# Patient Record
Sex: Male | Born: 1980 | Race: White | Hispanic: No | Marital: Single | Smoking: Never smoker
Health system: Southern US, Community
[De-identification: ages and names within clinical notes are randomized; demographics above are authoritative.]

## PROBLEM LIST (undated history)

## (undated) HISTORY — PX: KNEE SURGERY: SHX244

## (undated) HISTORY — PX: OTHER SURGICAL HISTORY: SHX169

---

## 2012-03-17 ENCOUNTER — Encounter (HOSPITAL_COMMUNITY): Payer: Self-pay | Admitting: Emergency Medicine

## 2012-03-17 ENCOUNTER — Emergency Department (HOSPITAL_COMMUNITY)
Admission: EM | Admit: 2012-03-17 | Discharge: 2012-03-17 | Disposition: A | Discharge status: Home / Self Care | Payer: Worker's Compensation | Attending: Emergency Medicine | Admitting: Emergency Medicine

## 2012-03-17 ENCOUNTER — Emergency Department (HOSPITAL_COMMUNITY): Payer: Worker's Compensation

## 2012-03-17 DIAGNOSIS — Y9289 Other specified places as the place of occurrence of the external cause: Secondary | ICD-10-CM | POA: Insufficient documentation

## 2012-03-17 DIAGNOSIS — M25539 Pain in unspecified wrist: Secondary | ICD-10-CM | POA: Insufficient documentation

## 2012-03-17 DIAGNOSIS — W208XXA Other cause of strike by thrown, projected or falling object, initial encounter: Secondary | ICD-10-CM | POA: Insufficient documentation

## 2012-03-17 DIAGNOSIS — M25532 Pain in left wrist: Secondary | ICD-10-CM

## 2012-03-17 NOTE — ED Notes (Signed)
Pt states he was at work and a piece of equipment fell on it  Pt has pain in his left wrist and hand  Pt has had ice on it since it happened

## 2012-03-17 NOTE — Discharge Instructions (Signed)
Your xrays are fortunately negative. Use the wrist splint as needed and continue to apply ice. You may take up to 800 mg of ibuprofen three times daily (with food) for pain as needed. Return to the ED with worsening pain, trouble moving the hand, increased swelling, or any other worrisome symptoms.  RESOURCE GUIDE  Chronic Pain Problems: Contact [facility]  [phone] Patients need to be referred by their primary care doctor.  Insufficient Money for Medicine: Contact United Way:  call "211" or [facility] (747)227-9174.  No Primary Care Doctor: - Call [facility]  [phone] - can help you locate a primary care doctor that  accepts your insurance, provides certain services, etc. - Physician Referral Service- [phone]  Agencies that provide inexpensive medical care: - [facility]  [phone] - [facility]  [phone] - [facility]  [phone] - Women's Clinic  [phone] - Planned Parenthood  [phone] [facility]  [phone]  Medicaid-accepting [facility] Providers: - [facility]- [address]  [phone], Mon-Fri 9am-7pm, Sat 9am-1pm - [facility]- [address], [address] [place]  [phone] - [facility]- [address] [place]  [phone] [facility]- [address]  [phone] - Renaye Rakers- [address] [place], [address], [phone]  Only accepts [place] patients after they have their name  applied to their card  Self Pay (no insurance) in [place]: - Sickle Cell Patients: Dr Willey Blade, [facility]  [address] [place], [phone] - [facility]- [address] [place]  [phone]       [facility]- [address]       -     [facility]- see information above (Speak to [employer] if you do not have  insurance)       -  [facility]- [address] [place], [phone]       -  [facility]- [address],  [phone]       -  [facility]- [address] [phone]       -  Dr Julio Sicks-  [address], [place], [phone]       -  [facility]- [address] [phone]       -  [facility]- [address], [phone], also [address] [phone]       -    [facility]- [address] [place], [phone], 1st & 3rd Saturday   every month, 10am-1pm  1) Find a Doctor and Pay Out of Pocket Although you won't have to find out who is covered by your insurance plan, it is a good idea to ask around and get recommendations. You will then need to call the office and see if the doctor you have chosen will accept you as a new patient and what types of options they offer for patients who are self-pay. Some doctors offer discounts or will set up payment plans for their patients who do not have insurance, but you will need to ask so you aren't surprised when you get to your appointment.  2) Contact Your Local Health Department Not all health departments have doctors that can see patients for  sick visits, but many do, so it is worth a call to see if yours does. If you don't know where your local health department is, you can check in your phone book. The CDC also has a tool to help you locate your state's health department, and many state websites also have listings of all of their local health departments.  3) Find a Walk-in Clinic If your illness is not likely to be very severe or complicated, you may want to try a walk in clinic. These are popping up all over the country in pharmacies, drugstores, and shopping centers. They're usually staffed by nurse practitioners or physician assistants that have been trained to treat common illnesses and complaints. They're usually fairly quick and inexpensive. However, if you have serious  medical issues or chronic medical problems, these are probably not your best option  STD Testing - [facility] Department of [facility], STD Clinic, [address] [place], phone [phone] or [phone].  Monday - Friday, call for an appointment. [facility] Department of [employer], STD Clinic, [address], [place], phone [phone] or [phone].  Monday - Friday, call for an appointment.  Abuse/Neglect: [facility] Child Abuse Hotline 605-782-9479 [facility] Child Abuse Hotline (424)216-3728 (After Hours)  Emergency Shelter:  [facility] (563) 781-4696  Maternity Homes: - [facility] [place] [facility] (405)868-0441 - [facility] [phone]  MRSA Hotline #:   8302308691  [facility] Resources  [facility]  [facility] Dept. [address]                 [address]         [address] [place] [address]  Blondell Reveal Phone:  [phone]                                  Phone:  [phone]                   Phone:  [phone]  [facility], [phone] - [phone]       -     [facility] in [place], [address]                                  [phone], [facility] Child Abuse Hotline 956-366-6416 or (234)230-8465 (After Hours)   Behavioral Health Services  Substance Abuse Resources: - [facility] Drug [facility]  [phone] - [facility] ([phone] - [facility] [phone] Floydene Flock 7262216067 - Residential & Outpatient Substance Abuse Program  [phone]  Psychological Services: [facility]  [phone] Services  [phone] - [facility], [phone] [place]. [address] [place], ACCESS LINE:  [phone] or [phone], [employer]  Dental Assistance  If unable to pay or uninsured, contact:  [facility] or [facility]. to become qualified for the adult dental clinic.  Patients with Medicaid: [facility] [phone] [address], [phone] [address] [phone]  If unable to pay, or uninsured, contact [facility] (541)350-3448) or [facility] [phone] in [place], [phone] in [facility]) to become qualified for the adult dental clinic  Other Low-Cost Community Dental Services: - [facility]- [address] [place], [place], [place], [address], [phone], [phone], 2nd and 4th Thursday of the month at 6:30am.  10 clients each day by appointment, can sometimes see walk-in patients if someone does not show for an appointment. [facility]- [address] [place], [place], [address], [phone] - [facility]- [address] [place], [place], [address], [phone] - [facility]- [phone] [facility] Department- [phone] [facility] Department- [phone]

## 2012-03-19 NOTE — ED Provider Notes (Signed)
History     CSN: [id number]  Arrival date & time 03/17/12  1940   First MD Initiated Contact with Patient 03/17/12 2209      Chief Complaint  Patient presents with  . Hand Injury    (Consider location/radiation/quality/duration/timing/severity/associated sxs/prior treatment) Patient is a 31 y.o. male presenting with hand injury. The history is provided by the patient.  Hand Injury  The incident occurred 3 to 5 hours ago. The incident occurred at work. The injury mechanism was compression. The pain is present in the left wrist and left hand. The quality of the pain is described as sharp. The pain is severe. The pain has been improving since the incident. He reports no foreign bodies present. The symptoms are aggravated by movement, use and palpation. He has tried ice for the symptoms. The treatment provided moderate relief.   Pt states he was at work when a heavy piece of equipment fell on his left hand. Currently c/o pain to the dorsum of his hand. He immediately applied ice to the area which has been significantly helpful. Denies numbness, weakness in the hand but pain increases with movement at the wrist.  He is right handed.  History reviewed. No pertinent past medical history.  Past Surgical History  Procedure Date  . Extraction of wisdom teeth   . Knee surgery   . Ulna surgery     Family History  Problem Relation Age of Onset  . Cancer Other   . Stroke Other   . Coronary artery disease Other     History  Substance Use Topics  . Smoking status: Never Smoker   . Smokeless tobacco: Not on file  . Alcohol Use: Yes     social       Review of Systems  Constitutional: Negative.   Musculoskeletal: Positive for myalgias.  Skin: Negative for wound.  Neurological: Negative for weakness and numbness.    Allergies  Sulfa antibiotics  Home Medications   Current Outpatient Rx  Name Route Sig Dispense Refill  . CETIRIZINE HCL 10 MG PO TABS Oral Take 10 mg by mouth  daily.    . IBUPROFEN 400 MG PO TABS Oral Take 400 mg by mouth every 6 (six) hours as needed.      BP 144/80  Pulse 84  Temp 98.1 F (36.7 C) (Oral)  Resp 18  SpO2 99%  Physical Exam  Nursing note and vitals reviewed. Constitutional: He appears well-developed and well-nourished. No distress.  HENT:  Head: Normocephalic and atraumatic.  Neck: Normal range of motion.  Cardiovascular: Normal rate.   Pulmonary/Chest: Effort normal.  Musculoskeletal: Normal range of motion.       Hands:      Slight erythema without edema to dorsum of L hand. Dorsum tender to palp as diagrammed. Pain with dorsiflexion of wrist. Neurovascularly intact with sensory intact to light touch in radian, median, and ulnar distributions. Radial pulse intact. Capillary refill less than 3 seconds.  Neurological: He is alert.  Skin: Skin is warm and dry. He is not diaphoretic.  Psychiatric: He has a normal mood and affect.    ED Course  Procedures (including critical care time)  Labs Reviewed - No data to display Dg Wrist Complete Left  03/17/2012  *RADIOLOGY REPORT*  Clinical Data: Pain, injury  LEFT WRIST - COMPLETE 3+ VIEW  Comparison: 03/17/2012  Findings: Normal alignment.  No fracture.  Intact distal radius, ulna and carpal bones.  No soft tissue swelling.  IMPRESSION: No acute osseous  finding  Original Report Authenticated By: Judie Petit. Ruel Favors, M.D.   Dg Hand Complete Left  03/17/2012  *RADIOLOGY REPORT*  Clinical Data: Pain, injury  LEFT HAND - COMPLETE 3+ VIEW  Comparison: None.  Findings: Normal alignment.  No fracture.  No radiographic swelling or foreign body.  IMPRESSION: No acute osseous finding  Original Report Authenticated By: Judie Petit. TREVOR Miles Costain, M.D.     1. Left wrist pain       MDM  Pt with pain to wrist and hand after a piece of equipment fell on it. Images which I personally reviewed are fortunately negative for acute findings. Pt placed in wrist splint for comfort. Note given to stay on  light duty at work for remainder of week. To f/u with ortho if sx persist. Return precautions discussed.        Grant Fontana, PA-C 03/19/12 0148

## 2012-03-19 NOTE — ED Provider Notes (Signed)
Medical screening examination/treatment/procedure(s) were performed by non-physician practitioner and as supervising physician I was immediately available for consultation/collaboration.  Flint Melter, MD 03/19/12 [phone]

## 2019-11-20 ENCOUNTER — Emergency Department (HOSPITAL_BASED_OUTPATIENT_CLINIC_OR_DEPARTMENT_OTHER)
Admission: EM | Admit: 2019-11-20 | Discharge: 2019-11-20 | Disposition: A | Discharge status: Home / Self Care | Payer: Worker's Compensation | Attending: Emergency Medicine | Admitting: Emergency Medicine

## 2019-11-20 ENCOUNTER — Other Ambulatory Visit: Payer: Self-pay

## 2019-11-20 ENCOUNTER — Encounter (HOSPITAL_BASED_OUTPATIENT_CLINIC_OR_DEPARTMENT_OTHER): Payer: Self-pay

## 2019-11-20 ENCOUNTER — Emergency Department (HOSPITAL_BASED_OUTPATIENT_CLINIC_OR_DEPARTMENT_OTHER): Payer: Worker's Compensation

## 2019-11-20 DIAGNOSIS — Y999 Unspecified external cause status: Secondary | ICD-10-CM | POA: Diagnosis not present

## 2019-11-20 DIAGNOSIS — S6702XA Crushing injury of left thumb, initial encounter: Secondary | ICD-10-CM

## 2019-11-20 DIAGNOSIS — Z79899 Other long term (current) drug therapy: Secondary | ICD-10-CM | POA: Insufficient documentation

## 2019-11-20 DIAGNOSIS — S61112A Laceration without foreign body of left thumb with damage to nail, initial encounter: Secondary | ICD-10-CM | POA: Diagnosis not present

## 2019-11-20 DIAGNOSIS — Y939 Activity, unspecified: Secondary | ICD-10-CM | POA: Diagnosis not present

## 2019-11-20 DIAGNOSIS — W231XXA Caught, crushed, jammed, or pinched between stationary objects, initial encounter: Secondary | ICD-10-CM | POA: Diagnosis not present

## 2019-11-20 DIAGNOSIS — Z882 Allergy status to sulfonamides status: Secondary | ICD-10-CM | POA: Insufficient documentation

## 2019-11-20 DIAGNOSIS — Y929 Unspecified place or not applicable: Secondary | ICD-10-CM | POA: Insufficient documentation

## 2019-11-20 MED ORDER — LIDOCAINE HCL (PF) 1 % IJ SOLN
20.0000 mL | Freq: Once | INTRAMUSCULAR | Status: AC
  Administered 2019-11-20: 19:00:00 20 mL
  Filled 2019-11-20: qty 20

## 2019-11-20 MED ORDER — CEPHALEXIN 250 MG PO CAPS
500.0000 mg | ORAL_CAPSULE | Freq: Once | ORAL | Status: AC
  Administered 2019-11-20: 20:00:00 500 mg via ORAL
  Filled 2019-11-20: qty 2

## 2019-11-20 MED ORDER — LIDOCAINE HCL 1 % IJ SOLN
INTRAMUSCULAR | Status: AC
  Filled 2019-11-20: qty 20

## 2019-11-20 MED ORDER — TETANUS-DIPHTH-ACELL PERTUSSIS 5-2.5-18.5 LF-MCG/0.5 IM SUSP
0.5000 mL | Freq: Once | INTRAMUSCULAR | Status: DC
  Filled 2019-11-20: qty 0.5

## 2019-11-20 MED ORDER — CEPHALEXIN 500 MG PO CAPS: 500.0000 mg | ORAL_CAPSULE | Freq: Three times a day (TID) | ORAL | 0 refills | Status: AC

## 2019-11-20 NOTE — Discharge Instructions (Addendum)
Take antibiotics as prescribed to prevent possible infection.  Continue dressing as instructed.  No heavy lifting in your left hand, do not use your left thumb for any work or any light lifting.  Recommend light duty at work.  Keep clean and dry.  No soaking.  Please call Monday morning the hand surgeons office.  You should be evaluated ideally on Monday or Tuesday of next week.  If you develop worsening bleeding, swelling, purulent drainage, redness streaking up your finger or hand, return to ER for reassessment.

## 2019-11-20 NOTE — ED Triage Notes (Signed)
Pt reports a crush injury to left thumb at work ~445p-NAD-steady gait

## 2019-11-20 NOTE — ED Notes (Signed)
MD at bedside. 

## 2019-11-20 NOTE — ED Provider Notes (Addendum)
[facility] EMERGENCY DEPARTMENT Provider Note   CSN: [id number] Arrival date & time: 11/20/19  1735     History Chief Complaint  Patient presents with  . Finger Injury    Jonathan Torres is a 39 y.o. male.  Right-hand-dominant presents to ER after crush injury to left thumb.  At work, piece of machinery crushed his left thumb.  Denies any other associated injuries.  No numbness to area, bleeding stopped with direct pressure.  No prior injury to this area.  Pain with any sort of movement.  Pain improved with rest.  Has not taken any medicine for it.  Sharp, stabbing.  Last tetanus 3 years ago.  HPI     History reviewed. No pertinent past medical history.  There are no problems to display for this patient.   Past Surgical History:  Procedure Laterality Date  . extraction of wisdom teeth    . KNEE SURGERY    . ulna surgery         Family History  Problem Relation Age of Onset  . Cancer Other   . Stroke Other   . Coronary artery disease Other     Social History   Tobacco Use  . Smoking status: Never Smoker  . Smokeless tobacco: Never Used  Substance Use Topics  . Alcohol use: Yes    Comment: weekly  . Drug use: No    Home Medications Prior to Admission medications   Medication Sig Start Date End Date Taking? Authorizing Provider  cephALEXin (KEFLEX) 500 MG capsule Take 1 capsule (500 mg total) by mouth 3 (three) times daily for 7 days. 11/20/19 11/27/19  Milagros Loll, MD  cetirizine (ZYRTEC) 10 MG tablet Take 10 mg by mouth daily.    [provider]  ibuprofen (ADVIL,MOTRIN) 400 MG tablet Take 400 mg by mouth every 6 (six) hours as needed.    [provider]    Allergies    Sulfa antibiotics  Review of Systems   Review of Systems  Constitutional: Negative for chills and fever.  HENT: Negative for ear pain and sore throat.   Eyes: Negative for pain and visual disturbance.  Respiratory: Negative for cough and shortness of  breath.   Cardiovascular: Negative for chest pain and palpitations.  Gastrointestinal: Negative for abdominal pain and vomiting.  Genitourinary: Negative for dysuria and hematuria.  Musculoskeletal: Negative for arthralgias and back pain.  Skin: Negative for color change and rash.  Neurological: Negative for seizures and syncope.  All other systems reviewed and are negative.   Physical Exam Updated Vital Signs BP 134/87 (BP Location: Right Arm)   Pulse 75   Temp 97.7 F (36.5 C) (Oral)   Resp 18   Ht 5\' 11"  (1.803 m)   Wt 97.5 kg   SpO2 97%   BMI 29.99 kg/m   Physical Exam Constitutional:      Appearance: Normal appearance.  HENT:     Head: Normocephalic and atraumatic.     Nose: Nose normal.     Mouth/Throat:     Mouth: Mucous membranes are moist.     Pharynx: Oropharynx is clear.  Eyes:     Pupils: Pupils are equal, round, and reactive to light.  Cardiovascular:     Rate and Rhythm: Normal rate and regular rhythm.     Pulses: Normal pulses.  Pulmonary:     Effort: Pulmonary effort is normal. No respiratory distress.  Abdominal:     General: Abdomen is flat. There is  no distension.  Musculoskeletal:       Hands:     Comments: 1cm laceration over distal thumb, extending from ulnar side through nail bed; nail appears broken across middle, no active bleeding, distal sensation and cap refill intact  Skin:    General: Skin is warm and dry.     Capillary Refill: Capillary refill takes less than 2 seconds.  Neurological:     Mental Status: He is alert.     ED Results / Procedures / Treatments   Labs (all labs ordered are listed, but only abnormal results are displayed) Labs Reviewed - No data to display  EKG None  Radiology DG Finger Thumb Left  Result Date: 11/20/2019 CLINICAL DATA:  Crush injury EXAM: LEFT THUMB 2+V COMPARISON:  None. FINDINGS: Acute comminuted fracture involving the tuft of the first distal phalanx. No subluxation. No radiopaque foreign  body IMPRESSION: Acute comminuted tuft fracture involving first distal phalanx Electronically Signed   By: Jasmine Pang M.D.   On: 11/20/2019 18:20    Procedures .Nail Removal  Date/Time: 11/20/2019 11:26 PM Performed by: Milagros Loll, MD Authorized by: Milagros Loll, MD   Consent:    Consent obtained:  Verbal   Consent given by:  Patient   Risks discussed:  Bleeding   Alternatives discussed:  No treatment Location:    Hand:  L thumb Pre-procedure details:    Skin preparation:  Betadine Anesthesia (see MAR for exact dosages):    Anesthesia method:  None Nail Removal:    Nail removed:  Complete   Nail bed repaired: yes     Nail bed repair material:  5-0 vicryl   Number of sutures:  3   Removed nail replaced and anchored: yes     Stented with:  Small foil from chromic gut Post-procedure details:    Dressing:  Xeroform gauze   Patient tolerance of procedure:  Tolerated well, no immediate complications  .Marland KitchenLaceration Repair  Date/Time: 11/20/2019 11:28 PM Performed by: Milagros Loll, MD Authorized by: Milagros Loll, MD   Consent:    Consent obtained:  Verbal   Consent given by:  Patient   Risks discussed:  Infection, nerve damage, need for additional repair, poor cosmetic result, poor wound healing and vascular damage   Alternatives discussed:  No treatment Anesthesia (see MAR for exact dosages):    Anesthesia method: digit block, 1% lidocaine, 10mL to ulnar and radial side at base of thumb. Laceration details:    Length (cm):  1 Repair type:    Repair type:  Complex Exploration:    Hemostasis achieved with:  Direct pressure Treatment:    Area cleansed with:  Betadine and saline   Amount of cleaning:  Extensive   Irrigation solution:  Sterile saline   Irrigation volume:  1 L   Irrigation method:  Syringe Skin repair:    Repair method:  Sutures   Suture size:  5-0   Suture material:  Nylon   Number of sutures:  1 Approximation:     Approximation:  Close   (including critical care time)  Medications Ordered in ED Medications  lidocaine (XYLOCAINE) 1 % (with pres) injection (has no administration in time range)  lidocaine (PF) (XYLOCAINE) 1 % injection 20 mL (20 mLs Infiltration Given 11/20/19 1832)  cephALEXin (KEFLEX) capsule 500 mg (500 mg Oral Given 11/20/19 1956)    ED Course  I have reviewed the triage vital signs and the nursing notes.  Pertinent labs & imaging results  that were available during my care of the patient were reviewed by me and considered in my medical decision making (see chart for details).  Clinical Course as of Nov 19 2320  Fri Nov 20, 2019  1933 Completed hand procedure, d/w Donnal Moat    [RD]    Clinical Course User Index [RD] Lucrezia Starch, MD    MDM Rules/Calculators/A&P                      39 year old male distal thumb crush injury.  Nondominant hand.  Concern for nailbed injury.  X-ray with fracture distal tuft.  Performed digital block of thumb with lidocaine.  Then thoroughly irrigated with normal saline and diluted Betadine.  Remove nail.  Underlying laceration across nailbed.  Repaired nailbed with 5-0 Vicryl, repaired skin with 5-0 nylon.  Good approximation.  Inserted small foil to keep eponychial fold open.  I reviewed with hand surgery who will follow up with patient on Monday.  Prescribed Keflex per hand surgery request for ABX prophylaxis.  Patient tolerated procedure well, discharged home.  After the discussed management above, the patient was determined to be safe for discharge.  The patient was in agreement with this plan and all questions regarding their care were answered.  ED return precautions were discussed and the patient will return to the ED with any significant worsening of condition.   Final Clinical Impression(s) / ED Diagnoses Final diagnoses:  Crushing injury of left thumb, initial encounter  Laceration of left thumb without foreign body with damage to  nail, initial encounter    Rx / DC Orders ED Discharge Orders         Ordered    cephALEXin (KEFLEX) 500 MG capsule  3 times daily     11/20/19 1950           Lucrezia Starch, MD 11/20/19 4970    Lucrezia Starch, MD 11/20/19 [phone]

## 2020-04-22 ENCOUNTER — Encounter (HOSPITAL_BASED_OUTPATIENT_CLINIC_OR_DEPARTMENT_OTHER): Payer: Self-pay

## 2020-04-22 ENCOUNTER — Other Ambulatory Visit: Payer: Self-pay

## 2020-04-22 DIAGNOSIS — T2122XA Burn of second degree of abdominal wall, initial encounter: Secondary | ICD-10-CM | POA: Insufficient documentation

## 2020-04-22 DIAGNOSIS — T22232A Burn of second degree of left upper arm, initial encounter: Secondary | ICD-10-CM | POA: Insufficient documentation

## 2020-04-22 DIAGNOSIS — Y999 Unspecified external cause status: Secondary | ICD-10-CM | POA: Insufficient documentation

## 2020-04-22 DIAGNOSIS — Y939 Activity, unspecified: Secondary | ICD-10-CM | POA: Insufficient documentation

## 2020-04-22 DIAGNOSIS — Y929 Unspecified place or not applicable: Secondary | ICD-10-CM | POA: Insufficient documentation

## 2020-04-22 DIAGNOSIS — Z5321 Procedure and treatment not carried out due to patient leaving prior to being seen by health care provider: Secondary | ICD-10-CM | POA: Insufficient documentation

## 2020-04-22 NOTE — ED Triage Notes (Addendum)
Pt with steam burn from pressure cooker/canning ~9pm to left upper arm with blisters, left flank with blisters, redness to left chest area and right anterior thigh-pt took ibuprofen 800mg  at ~930pm-states greatest pain is to left UE-sterile saline 4x4 wet/dry dsg applied-NAD-steady gait

## 2020-04-23 ENCOUNTER — Emergency Department (HOSPITAL_BASED_OUTPATIENT_CLINIC_OR_DEPARTMENT_OTHER)
Admission: EM | Admit: 2020-04-23 | Discharge: 2020-04-23 | Disposition: A | Discharge status: Home / Self Care | Payer: BC Managed Care – PPO | Attending: Emergency Medicine | Admitting: Emergency Medicine

## 2021-08-19 IMAGING — CR DG FINGER THUMB 2+V*L*
3 series · 3 of 3 positions shown · non-contrast
Comparison: None.

CLINICAL DATA: Crush injury

EXAM:
LEFT THUMB 2+V

[x finger obl. left]
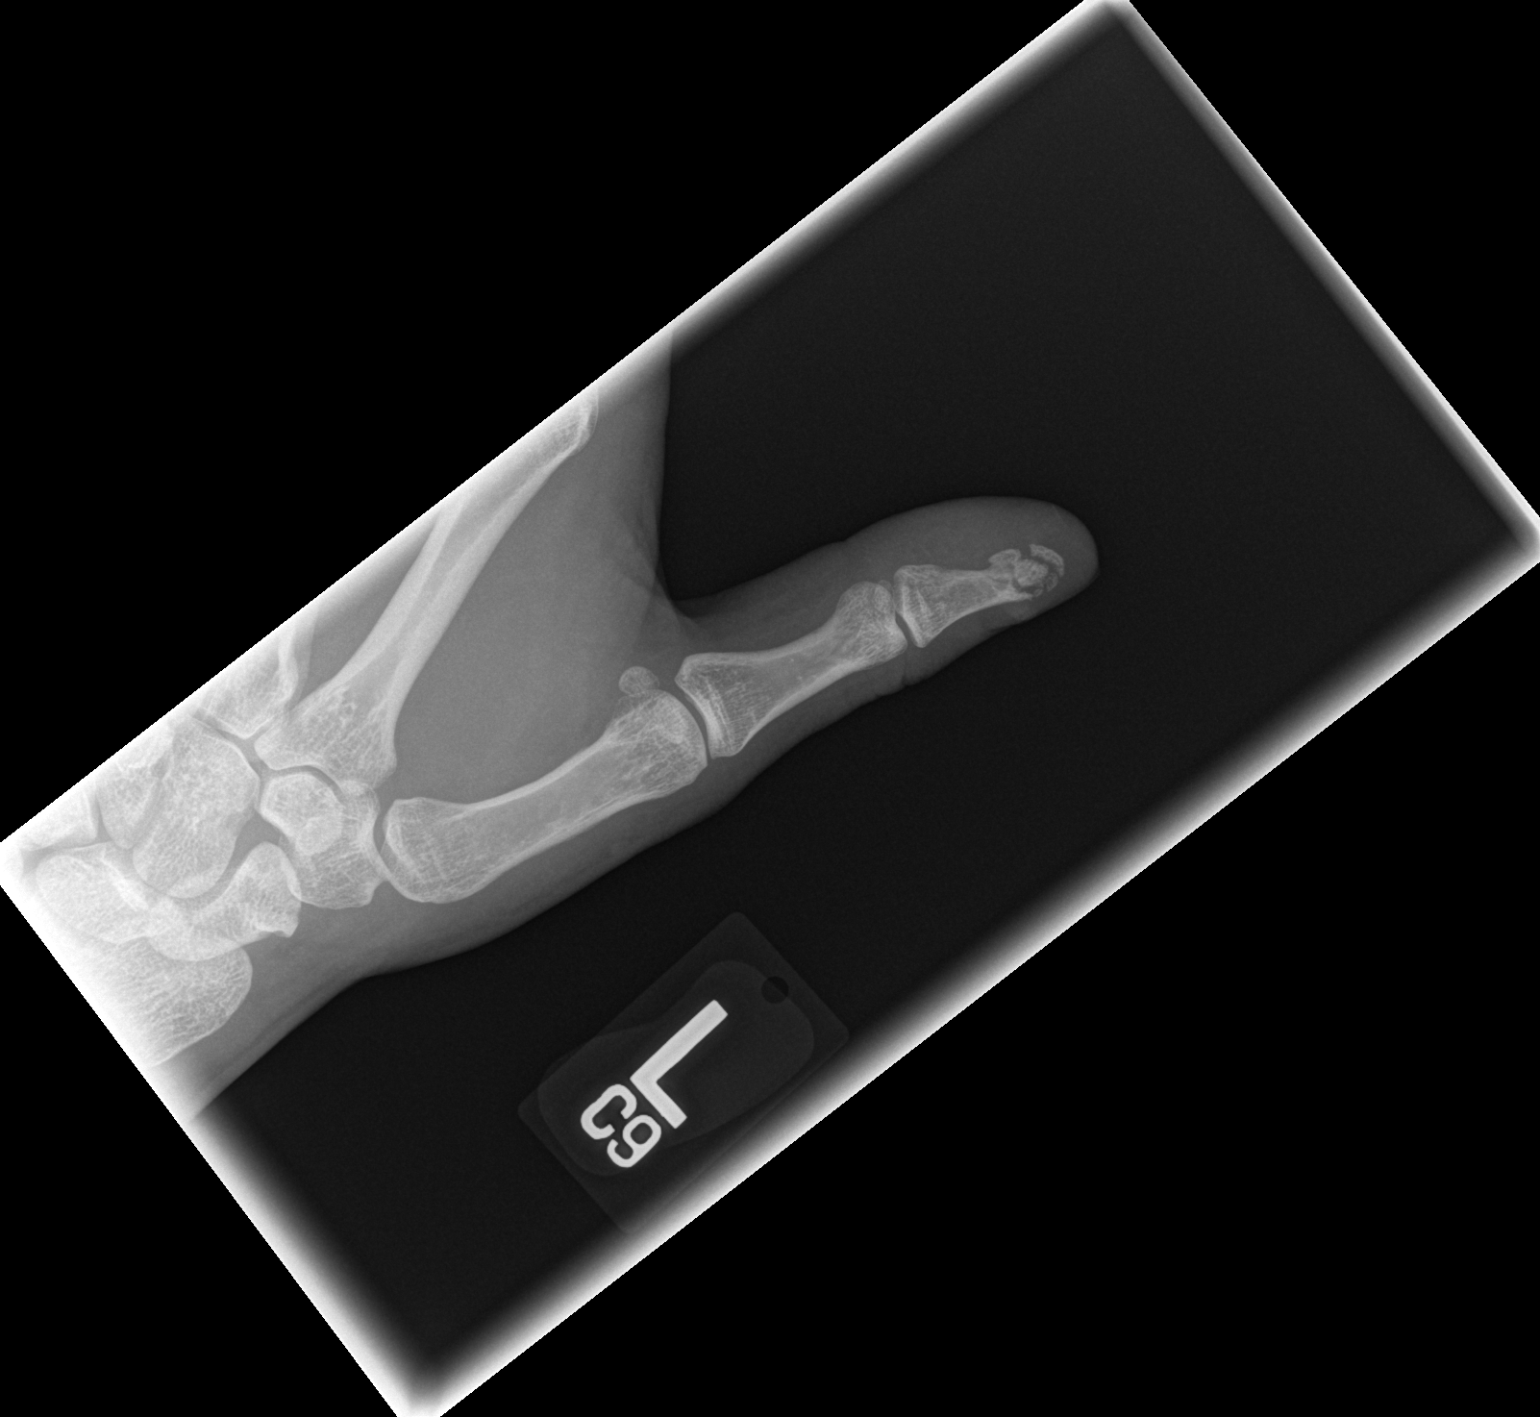

[x finger lateral left]
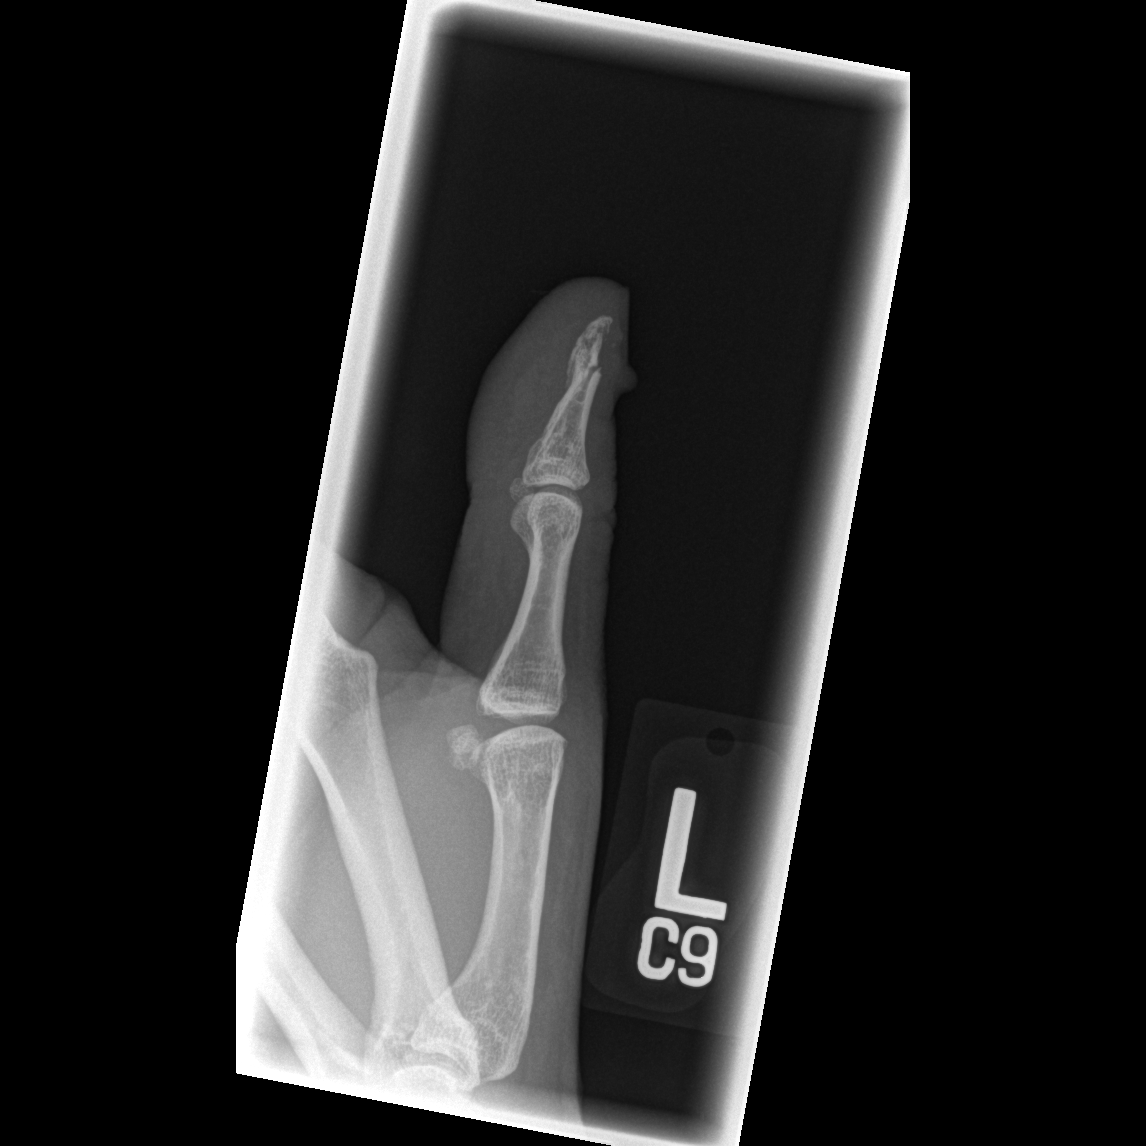

[x finger pa left]
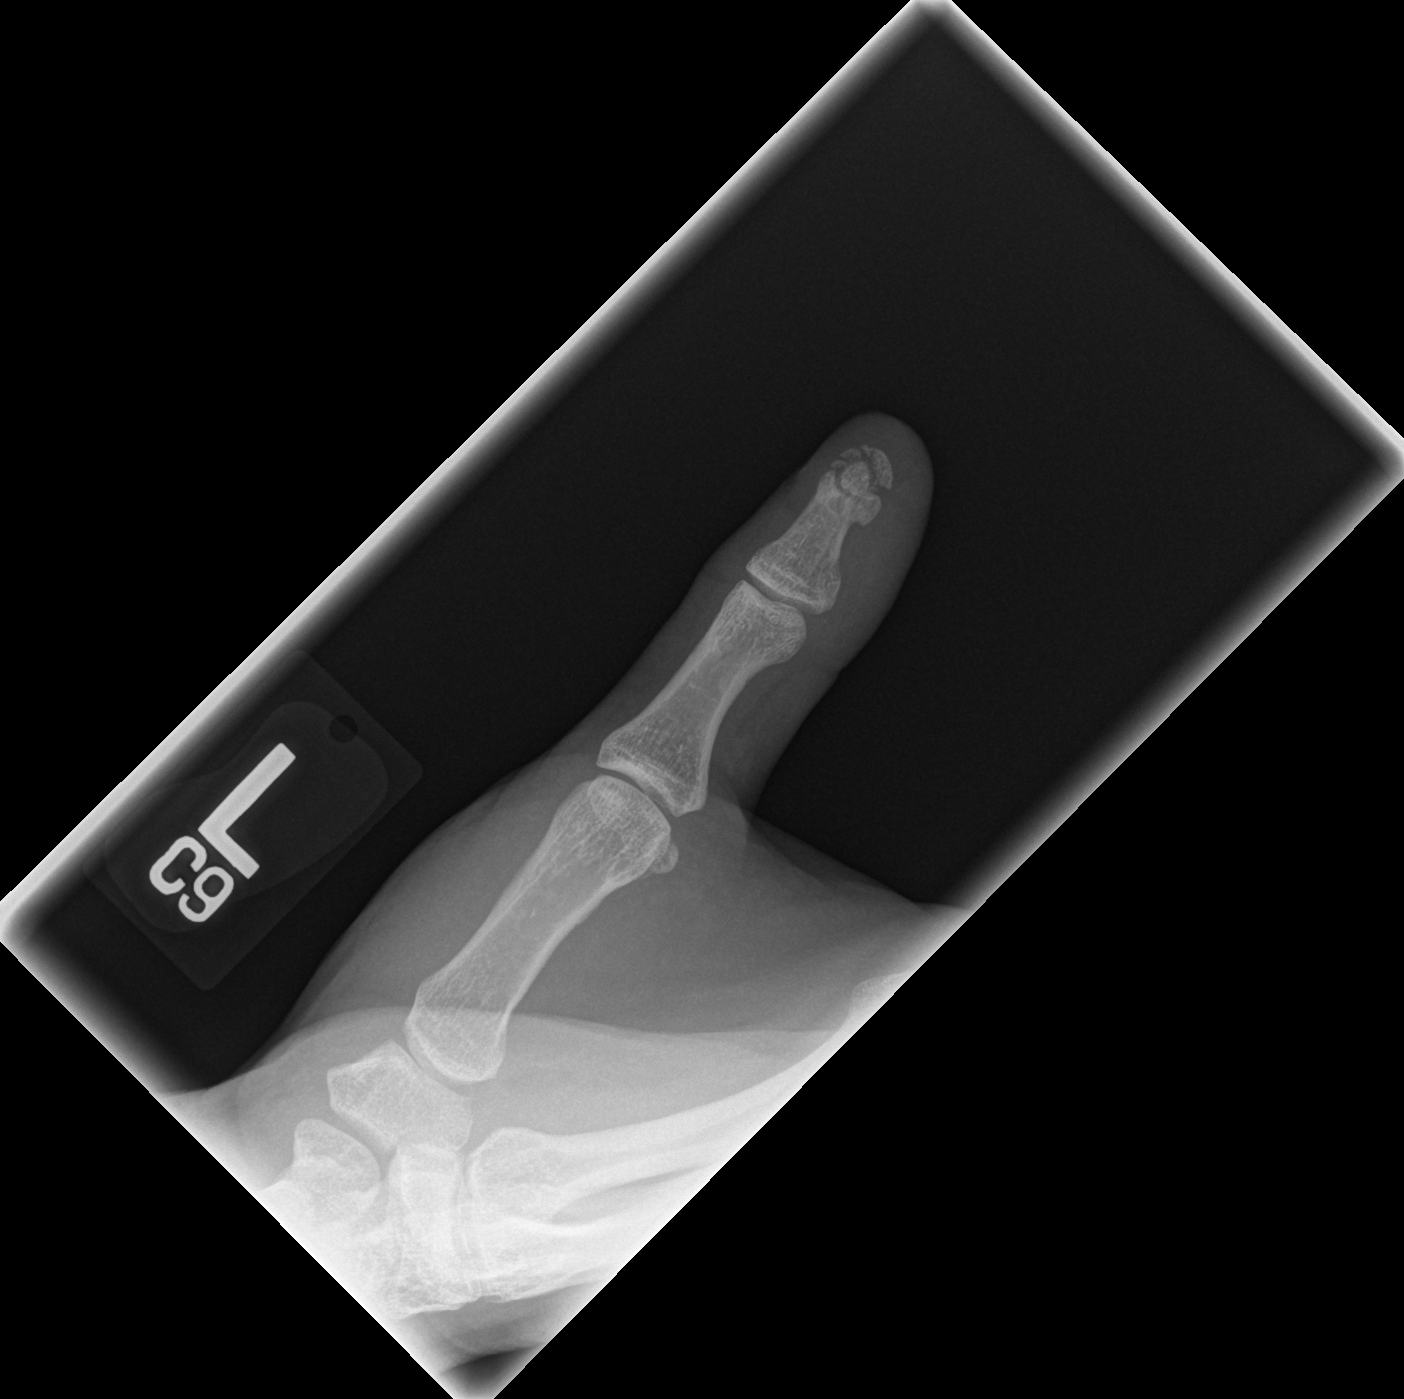

[3 of 3 positions shown; findings below may reference images not displayed]

FINDINGS: Acute comminuted fracture involving the tuft of the first distal
phalanx. No subluxation. No radiopaque foreign body
IMPRESSION: Acute comminuted tuft fracture involving first distal phalanx
